# Patient Record
Sex: Male | Born: 1985 | Race: Black or African American | Hispanic: No | Marital: Single | Smoking: Current every day smoker
Health system: Southern US, Community
[De-identification: ages and names within clinical notes are randomized; demographics above are authoritative.]

---

## 2016-07-18 ENCOUNTER — Encounter (HOSPITAL_COMMUNITY): Payer: Self-pay

## 2016-07-18 ENCOUNTER — Emergency Department (HOSPITAL_COMMUNITY): Payer: No Typology Code available for payment source

## 2016-07-18 ENCOUNTER — Emergency Department (HOSPITAL_COMMUNITY)
Admission: EM | Admit: 2016-07-18 | Discharge: 2016-07-18 | Disposition: A | Discharge status: Home / Self Care | Payer: No Typology Code available for payment source | Attending: Emergency Medicine | Admitting: Emergency Medicine

## 2016-07-18 DIAGNOSIS — I10 Essential (primary) hypertension: Secondary | ICD-10-CM | POA: Diagnosis not present

## 2016-07-18 DIAGNOSIS — F17210 Nicotine dependence, cigarettes, uncomplicated: Secondary | ICD-10-CM | POA: Insufficient documentation

## 2016-07-18 DIAGNOSIS — Y939 Activity, unspecified: Secondary | ICD-10-CM | POA: Insufficient documentation

## 2016-07-18 DIAGNOSIS — Y999 Unspecified external cause status: Secondary | ICD-10-CM | POA: Diagnosis not present

## 2016-07-18 DIAGNOSIS — S199XXA Unspecified injury of neck, initial encounter: Secondary | ICD-10-CM | POA: Diagnosis present

## 2016-07-18 DIAGNOSIS — Y92410 Unspecified street and highway as the place of occurrence of the external cause: Secondary | ICD-10-CM | POA: Diagnosis not present

## 2016-07-18 DIAGNOSIS — S161XXA Strain of muscle, fascia and tendon at neck level, initial encounter: Secondary | ICD-10-CM | POA: Diagnosis not present

## 2016-07-18 NOTE — ED Notes (Signed)
ED Provider at bedside. 

## 2016-07-18 NOTE — Discharge Instructions (Signed)
Your CT scan was negative today. Have your blood pressure rechecked in 1 week. [facility] high [facility] pressure [facility]. Return to the emergency department immediately if you develop any of the following symptoms:  You have numbness, tingling, or weakness in the arms or legs.  You develop severe headaches not relieved with medicine.  You have severe neck pain, especially tenderness in the middle of the back of your neck.  You have changes in bowel or bladder control.  There is increasing pain in any area of the body.  You have shortness of breath, light-headedness, dizziness, or fainting.  You have chest pain.  You feel sick to your stomach (nauseous), throw up (vomit), or sweat.  You have increasing abdominal discomfort.  There is blood in your urine, stool, or vomit.  You have pain in your shoulder (shoulder strap areas).  You feel your symptoms are getting worse.
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
